# Patient Record
Sex: Female | Born: 1960 | Race: Black or African American | Hispanic: No | Marital: Married | Smoking: Never smoker
Health system: Southern US, Community
[De-identification: ages and names within clinical notes are randomized; demographics above are authoritative.]

## PROBLEM LIST (undated history)

## (undated) HISTORY — PX: APPENDECTOMY: SHX54

## (undated) HISTORY — PX: TUBAL LIGATION: SHX77

## (undated) HISTORY — PX: ABDOMINAL HYSTERECTOMY: SHX81

---

## 2002-08-08 ENCOUNTER — Emergency Department (HOSPITAL_COMMUNITY): Admission: EM | Admit: 2002-08-08 | Discharge: 2002-08-08 | Payer: Self-pay | Admitting: *Deleted

## 2002-08-08 ENCOUNTER — Encounter: Payer: Self-pay | Admitting: *Deleted

## 2003-03-01 ENCOUNTER — Inpatient Hospital Stay (HOSPITAL_COMMUNITY): Admission: AD | Admit: 2003-03-01 | Discharge: 2003-03-05 | Payer: Self-pay | Admitting: Internal Medicine

## 2010-05-16 ENCOUNTER — Emergency Department (HOSPITAL_COMMUNITY)
Admission: EM | Admit: 2010-05-16 | Discharge: 2010-05-16 | Payer: Self-pay | Source: Home / Self Care | Admitting: Emergency Medicine

## 2010-08-21 LAB — CBC
HCT: 39.9 % (ref 36.0–46.0)
Hemoglobin: 13.7 g/dL (ref 12.0–15.0)
MCH: 31.9 pg (ref 26.0–34.0)
MCHC: 34.3 g/dL (ref 30.0–36.0)
MCV: 92.8 fL (ref 78.0–100.0)
Platelets: 291 10*3/uL (ref 150–400)
RBC: 4.3 MIL/uL (ref 3.87–5.11)
RDW: 13.5 % (ref 11.5–15.5)
WBC: 6.4 10*3/uL (ref 4.0–10.5)

## 2010-08-21 LAB — COMPREHENSIVE METABOLIC PANEL
ALT: 19 U/L (ref 0–35)
AST: 21 U/L (ref 0–37)
Albumin: 4.2 g/dL (ref 3.5–5.2)
Alkaline Phosphatase: 63 U/L (ref 39–117)
BUN: 13 mg/dL (ref 6–23)
CO2: 25 mEq/L (ref 19–32)
Calcium: 9.5 mg/dL (ref 8.4–10.5)
Chloride: 103 mEq/L (ref 96–112)
Creatinine, Ser: 0.95 mg/dL (ref 0.4–1.2)
GFR calc Af Amer: >60 mL/min (ref >60)
GFR calc non Af Amer: >60 mL/min (ref >60)
Glucose, Bld: 114 mg/dL — ABNORMAL HIGH (ref 70–99)
Potassium: 3.9 mEq/L (ref 3.5–5.1)
Sodium: 139 mEq/L (ref 135–145)
Total Bilirubin: 0.8 mg/dL (ref 0.3–1.2)
Total Protein: 7.9 g/dL (ref 6.0–8.3)

## 2010-08-21 LAB — URINALYSIS, ROUTINE W REFLEX MICROSCOPIC
Bilirubin Urine: NEGATIVE
Glucose, UA: NEGATIVE mg/dL
Leukocytes, UA: NEGATIVE
Nitrite: NEGATIVE
Protein, ur: NEGATIVE mg/dL
Specific Gravity, Urine: >1.03 — ABNORMAL HIGH (ref 1.005–1.030)
Urobilinogen, UA: 0.2 mg/dL (ref 0.0–1.0)
pH: 5 (ref 5.0–8.0)

## 2010-08-21 LAB — URINE MICROSCOPIC-ADD ON

## 2010-08-21 LAB — POCT CARDIAC MARKERS: Myoglobin, poc: 99.4 ng/mL (ref 12–200)

## 2010-10-27 NOTE — H&P (Signed)
NAME:  Beth Gray, Beth Gray                         ACCOUNT NO.:  [id number]   MEDICAL RECORD NO.:  [id number]                   PATIENT TYPE:  INP   LOCATION:  A341                                 FACILITY:  [facility]   PHYSICIAN:  Tesfaye D. Felecia Shelling, M.D.              DATE OF BIRTH:  [birth year]   DATE OF ADMISSION:  03/01/2003  DATE OF DISCHARGE:                                HISTORY & PHYSICAL   CHIEF COMPLAINT:  Swelling and pain of the left forearm.   HISTORY OF PRESENT ILLNESS:  This is a 50 year old black female with a  history of hypertension who was admitted with above complaints.  The patient  noticed an area of induration and small laceration about 1 week back on her  left forearm.  She had a small discharge and continued to have redness.  On  the third day of the onset she came to the office when she noticed another  area of similar induration.  She was given oral antibiotics and the patient  was sent home.  Two days later when she came for followup there was another  new area of similar lesion with a diffuse swelling and redness of the  forearm.  She was advised to continue the antibiotics and she was given a  prescription of pain medicine.  She came back today for followup and she had  her whole forearm swollen, red and very tender.  There is extension towards  her elbow joint.  There was a pussy discharge.  I have tried to drain as  much pus that could come from her lesion.  The patient was then advised for  admission for IV antibiotics.   REVIEW OF SYSTEMS:  The patient has intermittent low-grade fever.  No  chills, headache, cough, chest pain or shortness of breath.  No nausea,  abdominal pain, or dysuria or urgency.  No leg edema.   PAST MEDICAL HISTORY:  Hypertension.   CURRENT MEDICATION:  Altace 5 mg p.o. once daily.   PERSONAL AND SOCIAL HISTORY:  The patient works at [employer].  She has no  history of alcohol, tobacco, or substance abuse.   PHYSICAL  EXAMINATION:  GENERAL:  The patient is alert/awake and acutely sick  looking.  VITAL SIGNS:  Blood pressure 160/100, pulse 88, respiratory rate 16,  temperature 98 degrees Fahrenheit.  HEENT:  Pupils are equal/reactive.  NECK:  Supple.  CHEST:  Clear lung field.  Good air entry.  CARDIOVASCULAR SYSTEM:  Normal first and second heart sounds.  No murmur.  No gallop.  ABDOMEN:  Soft, lax, bowel sounds is positive.  No mass.  No organomegaly.  EXTREMITIES:  There is a diffuse swelling and redness of the left upper  forearm.  There are three areas where pussy discharge is draining.  Her  elbow joint movement is not affected.  There is no swelling of the joints.   ASSESSMENT:  Cellulitis of the left forearm.    PLAN:  1. We will start the patient on IV Cipro 400 mg IV piggyback b.i.d.  2. We will also start the patient on clindamycin 600 mg IV piggyback q.8h.  3. We will continue the patient on pain medicine and her hypertension     medication.     ___________________________________________                                         Eustaquio Maize Felecia Shelling, M.D.   TDF/MEDQ  D:  03/01/2003  T:  03/01/2003  Job:  [id number]

## 2010-10-27 NOTE — Discharge Summary (Signed)
   NAME:  Beth Gray, Beth Gray                         ACCOUNT NO.:  [id number]   MEDICAL RECORD NO.:  [id number]                   PATIENT TYPE:  INP   LOCATION:  A341                                 FACILITY:  [facility]   PHYSICIAN:  Tesfaye D. Felecia Shelling, M.D.              DATE OF BIRTH:  11-May-1961   DATE OF ADMISSION:  03/01/2003  DATE OF DISCHARGE:  03/05/2003                                 DISCHARGE SUMMARY   DISCHARGE DIAGNOSES:  1. Cellulitis of the left forearm.  2. Hypertension.   DISCHARGE MEDICATIONS:  1. Clindamycin 300 mg p.o. t.i.d. for five days.  2. Altace 5 mg p.o. q.d.   DISPOSITION:  The patient was discharged home in stable condition.   HOSPITAL COURSE:  This is a 50 year old black female with history of  hypertension who was admitted due to swelling and ulceration of the left  upper extremity. The patient thinks she was bitten by a spider. She was  being treated as outpatient. She received wound care and oral antibiotics;  however, her forearm started swelling, and the lesion started spreading. She  was found to have obvious cellulitis. She was admitted and was started on IV  antibiotics.     ___________________________________________                                         Eustaquio Maize Felecia Shelling, M.D.   TDF/MEDQ  D:  04/12/2003  T:  04/12/2003  Job:  [id number]

## 2011-06-06 ENCOUNTER — Emergency Department (HOSPITAL_COMMUNITY)
Admission: EM | Admit: 2011-06-06 | Discharge: 2011-06-06 | Disposition: A | Discharge status: Home / Self Care | Payer: 59 | Attending: Emergency Medicine | Admitting: Emergency Medicine

## 2011-06-06 ENCOUNTER — Other Ambulatory Visit: Payer: Self-pay

## 2011-06-06 ENCOUNTER — Encounter: Payer: Self-pay | Admitting: Emergency Medicine

## 2011-06-06 DIAGNOSIS — R112 Nausea with vomiting, unspecified: Secondary | ICD-10-CM | POA: Insufficient documentation

## 2011-06-06 DIAGNOSIS — R42 Dizziness and giddiness: Secondary | ICD-10-CM | POA: Insufficient documentation

## 2011-06-06 LAB — URINALYSIS, ROUTINE W REFLEX MICROSCOPIC
Glucose, UA: NEGATIVE mg/dL
Hgb urine dipstick: NEGATIVE
Leukocytes, UA: NEGATIVE
pH: 7 (ref 5.0–8.0)

## 2011-06-06 MED ORDER — MECLIZINE HCL 25 MG PO TABS: 25.0000 mg | ORAL_TABLET | Freq: Three times a day (TID) | ORAL | Status: AC | PRN

## 2011-06-06 MED ORDER — LORAZEPAM 1 MG PO TABS
2.0000 mg | ORAL_TABLET | Freq: Once | ORAL | Status: AC
  Administered 2011-06-06: 2 mg via ORAL
  Filled 2011-06-06: qty 2

## 2011-06-06 MED ORDER — ONDANSETRON 8 MG PO TBDP
8.0000 mg | ORAL_TABLET | Freq: Once | ORAL | Status: AC
  Administered 2011-06-06: 8 mg via ORAL
  Filled 2011-06-06: qty 1

## 2011-06-06 MED ORDER — MECLIZINE HCL 12.5 MG PO TABS
25.0000 mg | ORAL_TABLET | Freq: Once | ORAL | Status: AC
  Administered 2011-06-06: 25 mg via ORAL
  Filled 2011-06-06: qty 2

## 2011-06-06 NOTE — ED Notes (Addendum)
Patient c/o dizziness, nausea, and vomiting that started this morning. Denies any fevers. Patient does report headache and mid abd pain.

## 2011-06-06 NOTE — ED Provider Notes (Signed)
History     CSN: [id number]  Arrival date & time 06/06/11  1147   First MD Initiated Contact with Patient 06/06/11 1345      Chief Complaint  Patient presents with  . Dizziness  . Nausea  . Emesis     HPI  Dizziness/vertigo -  ONSET - THIS MORNING COURSE - WORSENING SEVERITY - MODERATE IT IS INTERMITTENT WORSENED BY - SITTING UP IMPROVED WITH - LYING SUPINE ASSOCIATED SYMPTOMS - NAUSEA/VOMITING AND MILD HEADACHE   PT REPORTS FEELING THAT "SURROUNDINGS SPINNING" SINCE THIS MORNING NO CP/SOB REPORTED NO FOCAL WEAKNESS NO FALLS SHE REPORTS MILD HEADACHE NO VISUAL CHANGES, NO HEARING CHANGES NO NECK/BACK PAIN SHE REPORTS MILD ABD PAIN No diarrhea  History reviewed. No pertinent past medical history.  Past Surgical History  Procedure Date  . Abdominal hysterectomy   . Appendectomy   . Tubal ligation     Family History  Problem Relation Age of Onset  . Cancer Other   . Diabetes Other   . Stroke Other   . Hypertension Other     History  Substance Use Topics  . Smoking status: Never Smoker   . Smokeless tobacco: Never Used  . Alcohol Use: No    OB History    Grav Para Term Preterm Abortions TAB SAB Ect Mult Living   4 2 2  2  2   2       Review of Systems  All other systems reviewed and are negative.    Allergies  Penicillins  Home Medications  No current outpatient prescriptions on file.  BP 154/98  Pulse 72  Temp(Src) 98 F (36.7 C) (Oral)  Resp 15  Ht 5\' 4"  (1.626 m)  Wt 203 lb (92.08 kg)  BMI 34.84 kg/m2  SpO2 100%  Physical Exam  CONSTITUTIONAL: Well developed/well nourished HEAD AND FACE: Normocephalic/atraumatic EYES: EOMI/PERRL, no nystagmus ENMT: Mucous membranes moist, left TM/right TM normal NECK: supple no meningeal signs SPINE:entire spine nontender CV: S1/S2 noted, no murmurs/rubs/gallops noted LUNGS: Lungs are clear to auscultation bilaterally, no apparent distress ABDOMEN: soft, nontender, no rebound or  guarding GU:no cva tenderness NEURO: Awake/alert, facies symmetric, no arm or leg drift is noted Cranial nerves 3/4/5/6/12/17/08/11/12 tested and intact Gait normal No pastpointing EXTREMITIES: pulses normal, full ROM SKIN: warm, color normal PSYCH: no abnormalities of mood noted    ED Course  Procedures  Labs Reviewed  URINALYSIS, ROUTINE W REFLEX MICROSCOPIC - Abnormal; Notable for the following:    Color, Urine STRAW (*)    All other components within normal limits   2:59 PM Pt well appearing Doubt acute neurologic event at this time  4:55 PM Pt improved Stable for d/c Discussed strict return precautions    MDM  Nursing notes reviewed and considered in documentation All labs/vitals reviewed and considered Previous records reviewed and considered        Date: 06/06/2011  Rate: 61  Rhythm: normal sinus rhythm  QRS Axis: normal  Intervals: normal  ST/T Wave abnormalities: nonspecific ST changes  Conduction Disutrbances:none  Narrative Interpretation:   Old EKG Reviewed: unchanged    Joya Gaskins, MD 06/06/11 1657

## 2011-06-06 NOTE — ED Notes (Signed)
Patient with no complaints at this time. Respirations even and unlabored. Skin warm/dry. Discharge instructions reviewed with patient at this time. Patient given opportunity to voice concerns/ask questions. Patient discharged at this time and left Emergency Department with steady gait.   

## 2012-11-29 ENCOUNTER — Emergency Department (HOSPITAL_COMMUNITY)
Admission: EM | Admit: 2012-11-29 | Discharge: 2012-11-29 | Disposition: A | Discharge status: Home / Self Care | Payer: 59 | Attending: Emergency Medicine | Admitting: Emergency Medicine

## 2012-11-29 ENCOUNTER — Encounter (HOSPITAL_COMMUNITY): Payer: Self-pay | Admitting: *Deleted

## 2012-11-29 ENCOUNTER — Other Ambulatory Visit: Payer: Self-pay

## 2012-11-29 ENCOUNTER — Emergency Department (HOSPITAL_COMMUNITY): Payer: 59

## 2012-11-29 DIAGNOSIS — Z880 Allergy status to penicillin: Secondary | ICD-10-CM | POA: Insufficient documentation

## 2012-11-29 DIAGNOSIS — R0789 Other chest pain: Secondary | ICD-10-CM

## 2012-11-29 DIAGNOSIS — R071 Chest pain on breathing: Secondary | ICD-10-CM | POA: Insufficient documentation

## 2012-11-29 DIAGNOSIS — R05 Cough: Secondary | ICD-10-CM | POA: Insufficient documentation

## 2012-11-29 DIAGNOSIS — R059 Cough, unspecified: Secondary | ICD-10-CM | POA: Insufficient documentation

## 2012-11-29 LAB — BASIC METABOLIC PANEL
BUN: 13 mg/dL (ref 6–23)
CO2: 29 mEq/L (ref 19–32)
Chloride: 102 mEq/L (ref 96–112)
Creatinine, Ser: 0.83 mg/dL (ref 0.50–1.10)
GFR calc Af Amer: >90 mL/min (ref >90)
Glucose, Bld: 115 mg/dL — ABNORMAL HIGH (ref 70–99)
Potassium: 3.5 mEq/L (ref 3.5–5.1)

## 2012-11-29 LAB — D-DIMER, QUANTITATIVE: D-Dimer, Quant: 0.38 ug/mL-FEU (ref 0.00–0.48)

## 2012-11-29 LAB — CBC WITH DIFFERENTIAL/PLATELET
Basophils Relative: 0 % (ref 0–1)
HCT: 34.6 % — ABNORMAL LOW (ref 36.0–46.0)
Hemoglobin: 11.5 g/dL — ABNORMAL LOW (ref 12.0–15.0)
Lymphocytes Relative: 35 % (ref 12–46)
Lymphs Abs: 1.6 10*3/uL (ref 0.7–4.0)
Monocytes Absolute: 0.6 10*3/uL (ref 0.1–1.0)
Monocytes Relative: 14 % — ABNORMAL HIGH (ref 3–12)
Neutro Abs: 1.9 10*3/uL (ref 1.7–7.7)
Neutrophils Relative %: 43 % (ref 43–77)
RBC: 3.62 MIL/uL — ABNORMAL LOW (ref 3.87–5.11)
WBC: 4.5 10*3/uL (ref 4.0–10.5)

## 2012-11-29 NOTE — ED Provider Notes (Signed)
History     CSN: [id number]  Arrival date & time 11/29/12  0224   First MD Initiated Contact with Patient 11/29/12 0413      Chief Complaint  Patient presents with  . Chest Pain    rt side, worse with inspiration  . Back Pain    rt side    (Consider location/radiation/quality/duration/timing/severity/associated sxs/prior treatment) HPI Comments: Beth Gray is a 52 y.o. female who presents complaining of right chest pain. That started yesterday. The pain is persistent. It is dull. She denies trauma. She has had a mild, nonproductive cough. She denies shortness of breath, nausea, vomiting, fever, chills, weakness, or dizziness. She has never had this previously. She has not tried anything for the problem. There are no known modifying factors.  The history is provided by the patient.    History reviewed. No pertinent past medical history.  Past Surgical History  Procedure Laterality Date  . Abdominal hysterectomy    . Appendectomy    . Tubal ligation      Family History  Problem Relation Age of Onset  . Cancer Other   . Diabetes Other   . Stroke Other   . Hypertension Other     History  Substance Use Topics  . Smoking status: Never Smoker   . Smokeless tobacco: Never Used  . Alcohol Use: No    OB History   Grav Para Term Preterm Abortions TAB SAB Ect Mult Living   4 2 2  2  2   2       Review of Systems  All other systems reviewed and are negative.    Allergies  Penicillins  Home Medications  No current outpatient prescriptions on file.  BP 153/94  Pulse 79  Temp(Src) 98.2 F (36.8 C) (Oral)  SpO2 95%  Physical Exam  Nursing note and vitals reviewed. Constitutional: She is oriented to person, place, and time. She appears well-developed and well-nourished.  HENT:  Head: Normocephalic and atraumatic.  Eyes: Conjunctivae and EOM are normal. Pupils are equal, round, and reactive to light.  Neck: Normal range of motion and phonation normal. Neck  supple.  Cardiovascular: Normal rate, regular rhythm and intact distal pulses.   Pulmonary/Chest: Effort normal and breath sounds normal. She exhibits tenderness (Mild right anterior and posterior chest wall tenderness to palpation).  Abdominal: Soft. She exhibits no distension. There is no tenderness. There is no guarding.  Musculoskeletal: Normal range of motion.  Neurological: She is alert and oriented to person, place, and time. She has normal strength. She exhibits normal muscle tone.  Skin: Skin is warm and dry.  Psychiatric: She has a normal mood and affect. Her behavior is normal. Judgment and thought content normal.    ED Course  Procedures (including critical care time)     Date: 11/29/12  Rate: 79  Rhythm: normal sinus rhythm  QRS Axis: normal  PR and QT Intervals: normal  ST/T Wave abnormalities: normal  PR and QRS Conduction Disutrbances:none  Narrative Interpretation:   Old EKG Reviewed: changes noted-nonspecific ST and T wave abnormality is resolved since 06/06/11    Labs Reviewed  CBC WITH DIFFERENTIAL - Abnormal; Notable for the following:    RBC 3.62 (*)    Hemoglobin 11.5 (*)    HCT 34.6 (*)    Monocytes Relative 14 (*)    Eosinophils Relative 8 (*)    All other components within normal limits  BASIC METABOLIC PANEL - Abnormal; Notable for the following:  Glucose, Bld 115 (*)    GFR calc non Af Amer 80 (*)    All other components within normal limits  D-DIMER, QUANTITATIVE   Dg Chest Portable 1 View  11/29/2012   *RADIOLOGY REPORT*  Clinical Data: Right side chest and back pain worse with a deep breath  PORTABLE CHEST - 1 VIEW  Comparison: Portable exam 0311 hours compared to 05/16/2010  Findings: Normal heart size, mediastinal contours and pulmonary vascularity for technique. Lungs clear. No pleural effusion or pneumothorax. Bones unremarkable.  IMPRESSION: No acute abnormalities.   Original Report Authenticated By: Ulyses Southward, M.D.     1. Chest wall  pain       MDM  Nonspecific chest pain, chest wall tenderness. Doubt ACS, PE, or pneumonia. Doubt metabolic instability, serious bacterial infection or impending vascular collapse; the patient is stable for discharge.    Nursing Notes Reviewed/ Care Coordinated, and agree without changes. Applicable Imaging Reviewed.  Interpretation of Laboratory Data incorporated into ED treatment    Plan: Home Medications-  symptomatic treatment with Tylenol or ibuprofen ; Home Treatments and Observation- heat to sore area; return here if the recommended treatment, does not improve the symptoms; Recommended follow up- PCP, for checkup in one week     Flint Melter, MD 11/29/12 [phone]

## 2012-11-29 NOTE — ED Notes (Signed)
Discharge instructions reviewed with pt, questions answered. Pt verbalized understanding.  

## 2012-11-29 NOTE — ED Notes (Signed)
Pt states rt side of chest hurts and radiates to rt side of back, worse when taking a deep breath.

## 2014-04-12 ENCOUNTER — Encounter (HOSPITAL_COMMUNITY): Payer: Self-pay | Admitting: *Deleted

## 2014-11-27 IMAGING — DX DG CHEST 1V PORT
1 series · 1 of 1 positions shown · non-contrast
Comparison: Portable exam 1444 hours compared to 05/16/2010

CLINICAL DATA: Right side chest and back pain worse with a deep
breath

PORTABLE CHEST - 1 VIEW

[portable]
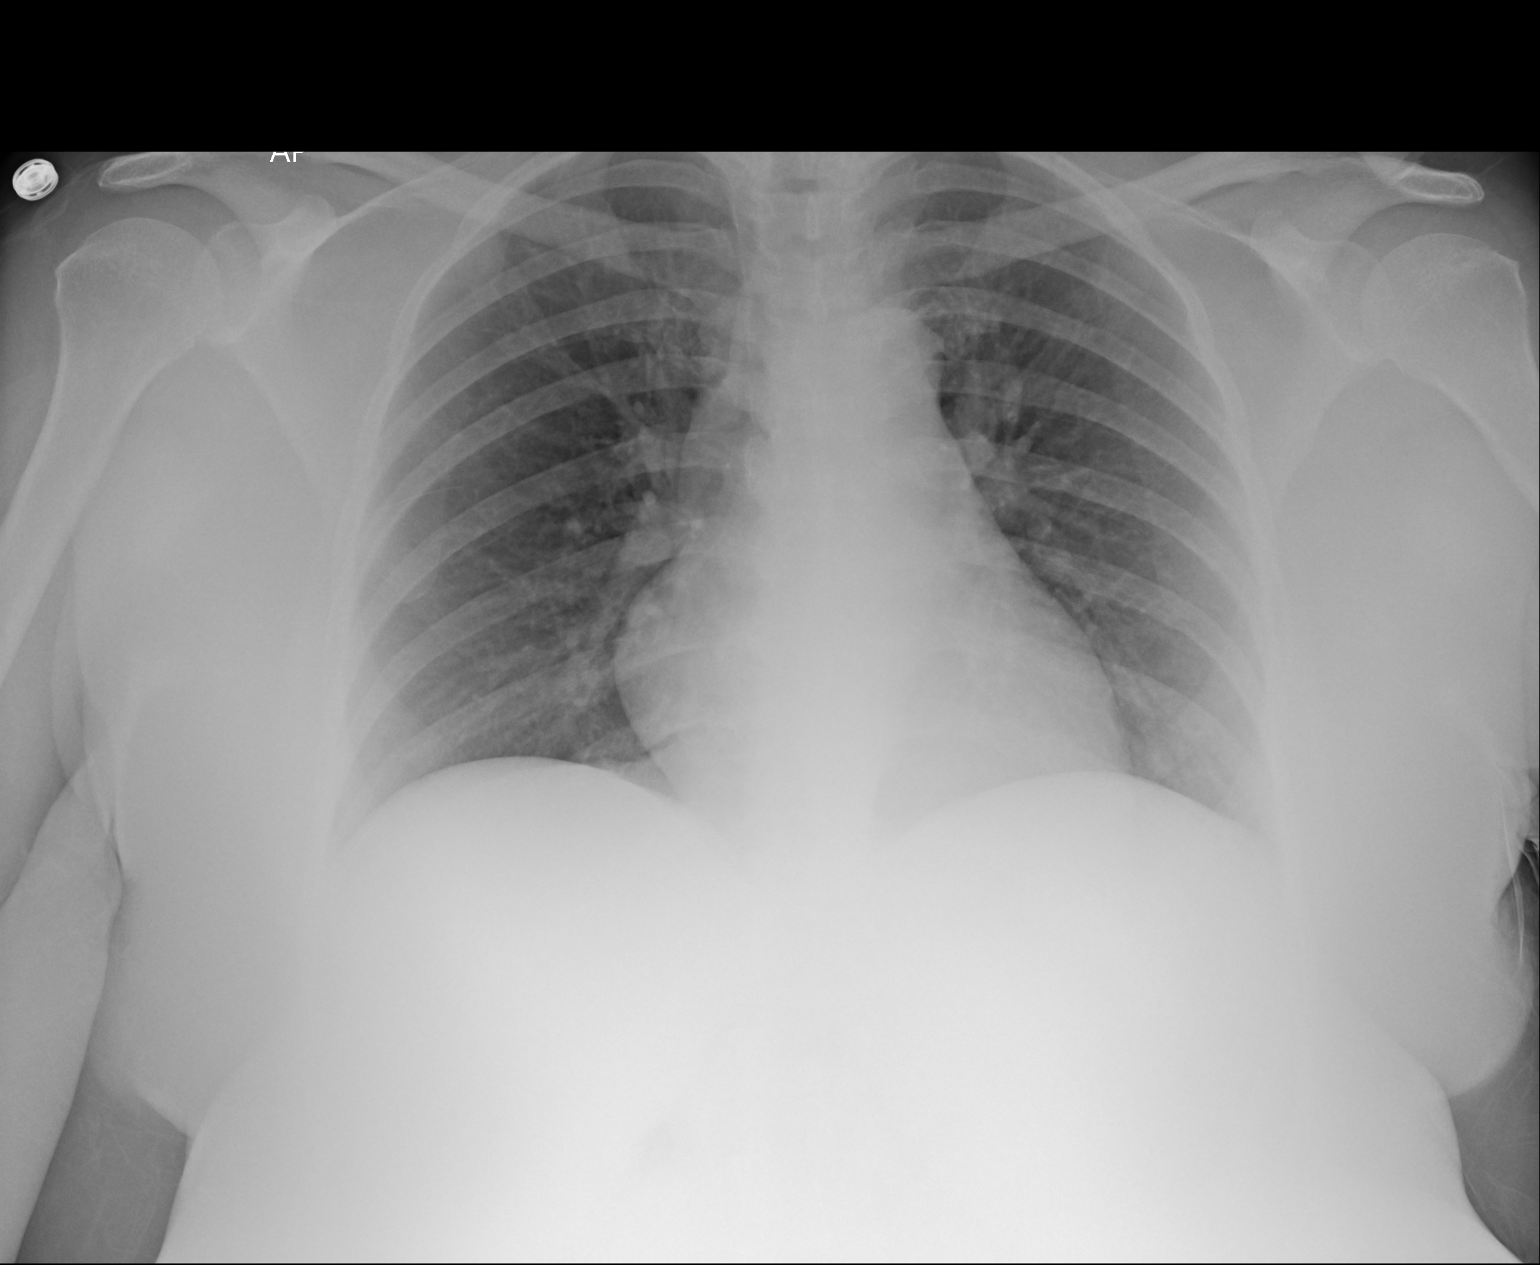

[1 of 1 positions shown; findings below may reference images not displayed]

FINDINGS: Normal heart size, mediastinal contours and pulmonary vascularity
for technique.
Lungs clear.
No pleural effusion or pneumothorax.
Bones unremarkable.
IMPRESSION: No acute abnormalities.

## 2016-07-11 ENCOUNTER — Emergency Department (HOSPITAL_COMMUNITY)
Admission: EM | Admit: 2016-07-11 | Discharge: 2016-07-11 | Disposition: A | Discharge status: Home / Self Care | Payer: BLUE CROSS/BLUE SHIELD | Attending: Emergency Medicine | Admitting: Emergency Medicine

## 2016-07-11 ENCOUNTER — Emergency Department (HOSPITAL_COMMUNITY): Admission: EM | Admit: 2016-07-11 | Discharge: 2016-07-11 | Discharge status: Absent without leave | Payer: Self-pay

## 2016-07-11 ENCOUNTER — Encounter (HOSPITAL_COMMUNITY): Payer: Self-pay | Admitting: *Deleted

## 2016-07-11 DIAGNOSIS — S61411A Laceration without foreign body of right hand, initial encounter: Secondary | ICD-10-CM | POA: Insufficient documentation

## 2016-07-11 DIAGNOSIS — Y999 Unspecified external cause status: Secondary | ICD-10-CM | POA: Insufficient documentation

## 2016-07-11 DIAGNOSIS — W268XXA Contact with other sharp object(s), not elsewhere classified, initial encounter: Secondary | ICD-10-CM | POA: Insufficient documentation

## 2016-07-11 DIAGNOSIS — Y9389 Activity, other specified: Secondary | ICD-10-CM | POA: Diagnosis not present

## 2016-07-11 DIAGNOSIS — Y929 Unspecified place or not applicable: Secondary | ICD-10-CM | POA: Insufficient documentation

## 2016-07-11 MED ORDER — BUPIVACAINE HCL (PF) 0.25 % IJ SOLN
10.0000 mL | Freq: Once | INTRAMUSCULAR | Status: AC
  Administered 2016-07-11: 10 mL
  Filled 2016-07-11: qty 30

## 2016-07-11 NOTE — ED Provider Notes (Signed)
[facility] Provider Note   CSN: [id number] Arrival date & time: 07/11/16  0102  Time seen 02:22 AM   History   Chief Complaint Chief Complaint  Patient presents with  . Laceration    HPI Beth Gray is a 56 y.o. female.  HPI  patient states around midnight she opened up a can and she accidentally cut the dorsum of her right hand on the lid. She states it continued to bleed so she came to the ED. Patient is right-handed. She denies any numbness or motor weakness in her hand. She states her last tetanus was 2 years ago.  PCP   Dr Felecia ShellingFanta  History reviewed. No pertinent past medical history.  There are no active problems to display for this patient.   Past Surgical History:  Procedure Laterality Date  . ABDOMINAL HYSTERECTOMY    . APPENDECTOMY    . TUBAL LIGATION      OB History    Gravida Para Term Preterm AB Living   4 2 2   2 2    SAB TAB Ectopic Multiple Live Births   2               Home Medications    Prior to Admission medications   Not on File    Family History Family History  Problem Relation Age of Onset  . Cancer Other   . Diabetes Other   . Stroke Other   . Hypertension Other     Social History Social History  Substance Use Topics  . Smoking status: Never Smoker  . Smokeless tobacco: Never Used  . Alcohol use No  employed in the OR at [employer]   Allergies   Penicillins   Review of Systems Review of Systems  All other systems reviewed and are negative.    Physical Exam Updated Vital Signs BP (!) 168/101   Pulse 84   Temp 97.8 F (36.6 C) (Oral)   Resp 20   Ht 5\' 4"  (1.626 m)   Wt 200 lb (90.7 kg)   SpO2 99%   BMI 34.33 kg/m   Vital signs normal except for hypertension   Physical Exam  Constitutional: She appears well-developed and well-nourished.  HENT:  Head: Normocephalic and atraumatic.  Right Ear: External ear normal.  Left Ear: External ear normal.  Eyes: Conjunctivae and EOM are normal.   Neck: Normal range of motion.  Cardiovascular: Normal rate.   Pulmonary/Chest: Effort normal. No respiratory distress.  Musculoskeletal: Normal range of motion. She exhibits no edema or deformity.  Skin: Skin is warm and dry.  Pt has a 2.5 cm laceration, linear, on the dorsum of her right hand, it goes just through the dermis.        ED Treatments / Results   Procedures Procedures (including critical care time)  LACERATION REPAIR Performed by: Ward GivensIva L Nakaila Freeze Authorized by: Ward GivensIva L Shela Esses Consent: Verbal consent obtained. Risks and benefits: risks, benefits and alternatives were discussed Consent given by: patient Patient identity confirmed: provided demographic data Prepped and Draped in normal sterile fashion Wound explored  Laceration Location: dorsum of right hand  Laceration Length: 2.5 cm  No Foreign Bodies seen or palpated  Anesthesia: local infiltration  Local anesthetic: 0.25 % marcaine  Anesthetic total: 6 ml   Amount of cleaning: standard betadyne/saline mix  Skin closure: 4-0 nylon   Number of sutures: 5  Technique: simple interrupted  Patient tolerance: Patient tolerated the procedure well with no immediate complications.  Medications Ordered in ED Medications  bupivacaine (PF) (MARCAINE) 0.25 % injection 10 mL (not administered)     Initial Impression / Assessment and Plan / ED Course  I have reviewed the triage vital signs and the nursing notes.  Pertinent labs & imaging results that were available during my care of the patient were reviewed by me and considered in my medical decision making (see chart for details).       Final Clinical Impressions(s) / ED Diagnoses   Final diagnoses:  Laceration of right hand without foreign body, initial encounter    Plan discharge  Devoria Albe, MD, Concha Pyo, MD 07/11/16 [phone]

## 2016-07-11 NOTE — Discharge Instructions (Signed)
Keep the wound clean and dry. DO use triple antibiotic ointment on the wound. You can take acetaminophen if needed for pain. The sutures need to be removed in 10-12 days. Recheck sooner for any signs of infection (increased redness, pain, swelling, red streaks, pus draining from the wound).

## 2016-07-11 NOTE — ED Triage Notes (Signed)
Pt c/o l laceration to top of right hand that happened while opening a can

## 2019-06-18 ENCOUNTER — Other Ambulatory Visit (HOSPITAL_COMMUNITY): Payer: Self-pay | Admitting: Internal Medicine

## 2019-06-18 DIAGNOSIS — Z6834 Body mass index (BMI) 34.0-34.9, adult: Secondary | ICD-10-CM | POA: Diagnosis not present

## 2019-06-18 DIAGNOSIS — N390 Urinary tract infection, site not specified: Secondary | ICD-10-CM | POA: Diagnosis not present

## 2019-06-18 DIAGNOSIS — R319 Hematuria, unspecified: Secondary | ICD-10-CM | POA: Diagnosis not present

## 2019-06-18 DIAGNOSIS — R739 Hyperglycemia, unspecified: Secondary | ICD-10-CM | POA: Diagnosis not present

## 2019-06-18 DIAGNOSIS — Z1231 Encounter for screening mammogram for malignant neoplasm of breast: Secondary | ICD-10-CM

## 2019-06-18 DIAGNOSIS — Z0001 Encounter for general adult medical examination with abnormal findings: Secondary | ICD-10-CM | POA: Diagnosis not present

## 2019-06-18 DIAGNOSIS — E785 Hyperlipidemia, unspecified: Secondary | ICD-10-CM | POA: Diagnosis not present

## 2019-06-19 ENCOUNTER — Encounter: Payer: Self-pay | Admitting: Gastroenterology

## 2019-06-24 DIAGNOSIS — E785 Hyperlipidemia, unspecified: Secondary | ICD-10-CM | POA: Diagnosis not present

## 2019-06-24 DIAGNOSIS — Z6835 Body mass index (BMI) 35.0-35.9, adult: Secondary | ICD-10-CM | POA: Diagnosis not present

## 2019-06-24 DIAGNOSIS — I10 Essential (primary) hypertension: Secondary | ICD-10-CM | POA: Diagnosis not present

## 2019-06-24 DIAGNOSIS — E1165 Type 2 diabetes mellitus with hyperglycemia: Secondary | ICD-10-CM | POA: Diagnosis not present

## 2019-08-03 ENCOUNTER — Other Ambulatory Visit: Payer: Self-pay

## 2019-08-03 ENCOUNTER — Ambulatory Visit: Payer: Self-pay

## 2019-08-20 ENCOUNTER — Ambulatory Visit: Payer: Self-pay | Attending: Internal Medicine

## 2019-08-20 DIAGNOSIS — Z23 Encounter for immunization: Secondary | ICD-10-CM

## 2019-08-20 NOTE — Progress Notes (Signed)
   Covid-19 Vaccination Clinic  Name:  Beth Gray    MRN: [id number] DOB: [birth year]  08/20/2019  [name] was observed post Covid-19 immunization for 15 minutes without incident. She was provided with Vaccine Information Sheet and instruction to access the V-Safe system.   [name] was instructed to call 911 with any severe reactions post vaccine: Marland Kitchen Difficulty breathing  . Swelling of face and throat  . A fast heartbeat  . A bad rash all over body  . Dizziness and weakness   Immunizations Administered    Name Date Dose VIS Date Route   Moderna COVID-19 Vaccine 08/20/2019  8:42 AM 0.5 mL 05/12/2019 Intramuscular   Manufacturer: Moderna   Lot: [id number]   NDC: 27741-287-86

## 2019-09-21 ENCOUNTER — Ambulatory Visit (HOSPITAL_COMMUNITY): Payer: Self-pay

## 2019-09-22 ENCOUNTER — Ambulatory Visit: Payer: Self-pay | Attending: Internal Medicine

## 2019-09-22 DIAGNOSIS — Z23 Encounter for immunization: Secondary | ICD-10-CM

## 2019-09-22 NOTE — Progress Notes (Signed)
   Covid-19 Vaccination Clinic  Name:  Beth Gray    MRN: [id number] DOB: 1961-01-09  09/22/2019  [name] was observed post Covid-19 immunization for 15 minutes without incident. She was provided with Vaccine Information Sheet and instruction to access the V-Safe system.   [name] was instructed to call 911 with any severe reactions post vaccine: Marland Kitchen Difficulty breathing  . Swelling of face and throat  . A fast heartbeat  . A bad rash all over body  . Dizziness and weakness   Immunizations Administered    Name Date Dose VIS Date Route   Moderna COVID-19 Vaccine 09/22/2019  8:08 AM 0.5 mL 05/12/2019 Intramuscular   Manufacturer: Moderna   Lot: [id number]   NDC: 03559-741-63

## 2019-10-05 ENCOUNTER — Ambulatory Visit: Payer: Self-pay

## 2019-10-05 ENCOUNTER — Other Ambulatory Visit: Payer: Self-pay

## 2019-12-18 DIAGNOSIS — Z79899 Other long term (current) drug therapy: Secondary | ICD-10-CM | POA: Diagnosis not present

## 2019-12-18 DIAGNOSIS — Z0001 Encounter for general adult medical examination with abnormal findings: Secondary | ICD-10-CM | POA: Diagnosis not present

## 2019-12-18 DIAGNOSIS — E785 Hyperlipidemia, unspecified: Secondary | ICD-10-CM | POA: Diagnosis not present

## 2019-12-18 DIAGNOSIS — I10 Essential (primary) hypertension: Secondary | ICD-10-CM | POA: Diagnosis not present

## 2019-12-18 DIAGNOSIS — E1165 Type 2 diabetes mellitus with hyperglycemia: Secondary | ICD-10-CM | POA: Diagnosis not present

## 2019-12-23 DIAGNOSIS — Z1212 Encounter for screening for malignant neoplasm of rectum: Secondary | ICD-10-CM | POA: Diagnosis not present

## 2019-12-23 DIAGNOSIS — Z1211 Encounter for screening for malignant neoplasm of colon: Secondary | ICD-10-CM | POA: Diagnosis not present

## 2020-01-12 DIAGNOSIS — E119 Type 2 diabetes mellitus without complications: Secondary | ICD-10-CM | POA: Diagnosis not present

## 2020-04-22 DIAGNOSIS — E1165 Type 2 diabetes mellitus with hyperglycemia: Secondary | ICD-10-CM | POA: Diagnosis not present

## 2020-04-22 DIAGNOSIS — Z6835 Body mass index (BMI) 35.0-35.9, adult: Secondary | ICD-10-CM | POA: Diagnosis not present

## 2020-04-22 DIAGNOSIS — I10 Essential (primary) hypertension: Secondary | ICD-10-CM | POA: Diagnosis not present

## 2020-04-22 DIAGNOSIS — E785 Hyperlipidemia, unspecified: Secondary | ICD-10-CM | POA: Diagnosis not present

## 2020-04-22 DIAGNOSIS — Z23 Encounter for immunization: Secondary | ICD-10-CM | POA: Diagnosis not present

## 2020-07-25 DIAGNOSIS — E1165 Type 2 diabetes mellitus with hyperglycemia: Secondary | ICD-10-CM | POA: Diagnosis not present

## 2020-07-25 DIAGNOSIS — I10 Essential (primary) hypertension: Secondary | ICD-10-CM | POA: Diagnosis not present

## 2020-07-25 DIAGNOSIS — Z6833 Body mass index (BMI) 33.0-33.9, adult: Secondary | ICD-10-CM | POA: Diagnosis not present

## 2020-07-25 DIAGNOSIS — E785 Hyperlipidemia, unspecified: Secondary | ICD-10-CM | POA: Diagnosis not present

## 2020-07-27 DIAGNOSIS — I10 Essential (primary) hypertension: Secondary | ICD-10-CM | POA: Diagnosis not present

## 2020-12-26 DIAGNOSIS — E1165 Type 2 diabetes mellitus with hyperglycemia: Secondary | ICD-10-CM | POA: Diagnosis not present

## 2020-12-26 DIAGNOSIS — I10 Essential (primary) hypertension: Secondary | ICD-10-CM | POA: Diagnosis not present

## 2020-12-26 DIAGNOSIS — J029 Acute pharyngitis, unspecified: Secondary | ICD-10-CM | POA: Diagnosis not present

## 2021-01-02 DIAGNOSIS — Z23 Encounter for immunization: Secondary | ICD-10-CM | POA: Diagnosis not present

## 2021-01-02 DIAGNOSIS — E785 Hyperlipidemia, unspecified: Secondary | ICD-10-CM | POA: Diagnosis not present

## 2021-01-02 DIAGNOSIS — E1165 Type 2 diabetes mellitus with hyperglycemia: Secondary | ICD-10-CM | POA: Diagnosis not present

## 2021-01-02 DIAGNOSIS — I10 Essential (primary) hypertension: Secondary | ICD-10-CM | POA: Diagnosis not present

## 2021-01-02 DIAGNOSIS — Z0001 Encounter for general adult medical examination with abnormal findings: Secondary | ICD-10-CM | POA: Diagnosis not present

## 2021-02-06 DIAGNOSIS — I10 Essential (primary) hypertension: Secondary | ICD-10-CM | POA: Diagnosis not present

## 2021-02-06 DIAGNOSIS — E1165 Type 2 diabetes mellitus with hyperglycemia: Secondary | ICD-10-CM | POA: Diagnosis not present

## 2021-04-26 DIAGNOSIS — Z6833 Body mass index (BMI) 33.0-33.9, adult: Secondary | ICD-10-CM | POA: Diagnosis not present

## 2021-04-26 DIAGNOSIS — I10 Essential (primary) hypertension: Secondary | ICD-10-CM | POA: Diagnosis not present

## 2021-04-26 DIAGNOSIS — E1165 Type 2 diabetes mellitus with hyperglycemia: Secondary | ICD-10-CM | POA: Diagnosis not present

## 2021-04-26 DIAGNOSIS — E785 Hyperlipidemia, unspecified: Secondary | ICD-10-CM | POA: Diagnosis not present

## 2021-11-17 DIAGNOSIS — E1165 Type 2 diabetes mellitus with hyperglycemia: Secondary | ICD-10-CM | POA: Diagnosis not present

## 2021-11-17 DIAGNOSIS — E785 Hyperlipidemia, unspecified: Secondary | ICD-10-CM | POA: Diagnosis not present

## 2021-11-17 DIAGNOSIS — Z0001 Encounter for general adult medical examination with abnormal findings: Secondary | ICD-10-CM | POA: Diagnosis not present

## 2021-11-17 DIAGNOSIS — I10 Essential (primary) hypertension: Secondary | ICD-10-CM | POA: Diagnosis not present

## 2024-04-13 ENCOUNTER — Ambulatory Visit
Admission: EM | Admit: 2024-04-13 | Discharge: 2024-04-13 | Disposition: A | Discharge status: Home / Self Care | Attending: Nurse Practitioner | Admitting: Nurse Practitioner

## 2024-04-13 DIAGNOSIS — K047 Periapical abscess without sinus: Secondary | ICD-10-CM

## 2024-04-13 MED ORDER — CLINDAMYCIN HCL 150 MG PO CAPS: 450.0000 mg | ORAL_CAPSULE | Freq: Three times a day (TID) | ORAL | 0 refills | Status: AC

## 2024-04-13 MED ORDER — KETOROLAC TROMETHAMINE 30 MG/ML IJ SOLN
30.0000 mg | Freq: Once | INTRAMUSCULAR | Status: AC
  Administered 2024-04-13: 30 mg via INTRAMUSCULAR

## 2024-04-13 MED ORDER — ACETAMINOPHEN ER 650 MG PO TBCR: 650.0000 mg | EXTENDED_RELEASE_TABLET | Freq: Three times a day (TID) | ORAL | 0 refills | Status: AC

## 2024-04-13 NOTE — ED Triage Notes (Signed)
 Pt reports left sided facial swelling and pain in the neck x 3 days

## 2024-04-13 NOTE — Discharge Instructions (Addendum)
 Take medication as prescribed.  Recommend taking the medication with a probiotic to prevent GI upset. Continue over-the-counter Tylenol as needed for pain or discomfort. Recommend the use of warm compresses to the affected area to help with pain or discomfort.  You may use cool compresses to help with pain or swelling. Warm salt water gargles 3-4 times daily as needed while symptoms persist. Continue a soft diet to include soup, broth, yogurt, pudding, or Jell-O while symptoms persist. It is recommended that you follow-up with a dentist within the next 7 to 10 days for reevaluation.  I have provided a list of dental resources in the area. Go to the emergency department if you experience worsening pain, facial swelling, or other concerns. Follow-up as needed.

## 2024-04-13 NOTE — ED Provider Notes (Signed)
 [facility]    CSN: [id number] Arrival date & time: 04/13/24  1027      History   Chief Complaint No chief complaint on file.   HPI Beth Gray is a 63 y.o. female.   The history is provided by the patient.   Patient presents for complaints of left-sided facial pain and swelling that started over the past 2 to 3 days.  Patient states she first noticed pain in the left side of her mouth, and the swelling started yesterday.  She also complains of pain that radiates behind the left ear and into the left side of her neck.  She denies fever, chills, injury, trauma, chest pain, abdominal pain, nausea, or vomiting.  Patient states that she does have some teeth that are broken in the same area.  History reviewed. No pertinent past medical history.  There are no active problems to display for this patient.   Past Surgical History:  Procedure Laterality Date   ABDOMINAL HYSTERECTOMY     APPENDECTOMY     TUBAL LIGATION      OB History     Gravida  4   Para  2   Term  2   Preterm      AB  2   Living  2      SAB  2   IAB      Ectopic      Multiple      Live Births               Home Medications    Prior to Admission medications   Medication Sig Start Date End Date Taking? Authorizing Provider  acetaminophen (TYLENOL 8 HOUR) 650 MG CR tablet Take 1 tablet (650 mg total) by mouth every 8 (eight) hours. 04/13/24  Yes Leath-Warren, Etta PARAS, NP  clindamycin (CLEOCIN) 150 MG capsule Take 3 capsules (450 mg total) by mouth 3 (three) times daily for 7 days. 04/13/24 04/20/24 Yes Leath-Warren, Etta PARAS, NP    Family History Family History  Problem Relation Age of Onset   Cancer Other    Diabetes Other    Stroke Other    Hypertension Other     Social History Social History   Tobacco Use   Smoking status: Never   Smokeless tobacco: Never  Substance Use Topics   Alcohol use: No   Drug use: No     Allergies    Penicillins   Review of Systems Review of Systems Per HPI  Physical Exam Triage Vital Signs ED Triage Vitals  Encounter Vitals Group     BP 04/13/24 1042 (!) 141/88     Girls Systolic BP Percentile --      Girls Diastolic BP Percentile --      Boys Systolic BP Percentile --      Boys Diastolic BP Percentile --      Pulse Rate 04/13/24 1042 92     Resp 04/13/24 1042 20     Temp 04/13/24 1042 97.7 F (36.5 C)     Temp Source 04/13/24 1042 Oral     SpO2 04/13/24 1042 95 %     Weight --      Height --      Head Circumference --      Peak Flow --      Pain Score 04/13/24 1044 10     Pain Loc --      Pain Education --      Exclude from [employer]  Chart --    No data found.  Updated Vital Signs BP (!) 141/88 (BP Location: Right Arm)   Pulse 92   Temp 97.7 F (36.5 C) (Oral)   Resp 20   SpO2 95%   Visual Acuity Right Eye Distance:   Left Eye Distance:   Bilateral Distance:    Right Eye Near:   Left Eye Near:    Bilateral Near:     Physical Exam Vitals and nursing note reviewed.  Constitutional:      General: She is not in acute distress.    Appearance: Normal appearance.  HENT:     Head: Normocephalic.     Right Ear: Tympanic membrane, ear canal and external ear normal.     Left Ear: Tympanic membrane, ear canal and external ear normal.     Nose: Nose normal.     Mouth/Throat:     Lips: Pink.     Mouth: Mucous membranes are moist.     Dentition: Abnormal dentition. Dental tenderness, gingival swelling, dental caries and dental abscesses present.     Pharynx: Oropharynx is clear. Uvula midline.     Comments: Teeth numbers 18 and 19 are fractured with dental caries present.  The areas are tender to palpation, gingival swelling is present. Eyes:     Extraocular Movements: Extraocular movements intact.     Pupils: Pupils are equal, round, and reactive to light.  Cardiovascular:     Rate and Rhythm: Normal rate and regular rhythm.     Pulses: Normal pulses.      Heart sounds: Normal heart sounds.  Pulmonary:     Effort: Pulmonary effort is normal. No respiratory distress.     Breath sounds: Normal breath sounds. No stridor. No wheezing, rhonchi or rales.  Abdominal:     General: Bowel sounds are normal.     Palpations: Abdomen is soft.  Musculoskeletal:     Cervical back: Normal range of motion.  Lymphadenopathy:     Cervical: No cervical adenopathy.  Skin:    General: Skin is warm and dry.  Neurological:     General: No focal deficit present.     Mental Status: She is alert and oriented to person, place, and time.  Psychiatric:        Mood and Affect: Mood normal.        Behavior: Behavior normal.      UC Treatments / Results  Labs (all labs ordered are listed, but only abnormal results are displayed) Labs Reviewed - No data to display  EKG   Radiology No results found.  Procedures Procedures (including critical care time)  Medications Ordered in UC Medications  ketorolac (TORADOL) 30 MG/ML injection 30 mg (has no administration in time range)    Initial Impression / Assessment and Plan / UC Course  I have reviewed the triage vital signs and the nursing notes.  Pertinent labs & imaging results that were available during my care of the patient were reviewed by me and considered in my medical decision making (see chart for details).  Patient presents for complaints of swelling to the left lower jaw that is been present for the past several days.  On exam, she does have fractures to teeth numbers 18 and 19 along with dental caries.  Symptoms are consistent with dental abscess.  Will treat with clindamycin 450 mg 3 times daily for the next 7 days and Tylenol 650 mg for pain..  Supportive care recommendations were provided discussed with the patient  to include warm salt water gargles, a soft diet, and the use of warm or cool compresses.  Patient was provided a list of dental resources in the area as she will need follow-up within  the next 7 to 10 days for reevaluation.  Patient was also given strict ER follow-up precautions.  Patient was in agreement with this plan of care and verbalizes understanding.  All questions were answered.  Patient stable for discharge.  Work note was provided.   Final Clinical Impressions(s) / UC Diagnoses   Final diagnoses:  Dental abscess     Discharge Instructions      Take medication as prescribed.  Recommend taking the medication with a probiotic to prevent GI upset. Continue over-the-counter Tylenol as needed for pain or discomfort. Recommend the use of warm compresses to the affected area to help with pain or discomfort.  You may use cool compresses to help with pain or swelling. Warm salt water gargles 3-4 times daily as needed while symptoms persist. Continue a soft diet to include soup, broth, yogurt, pudding, or Jell-O while symptoms persist. It is recommended that you follow-up with a dentist within the next 7 to 10 days for reevaluation.  I have provided a list of dental resources in the area. Go to the emergency department if you experience worsening pain, facial swelling, or other concerns. Follow-up as needed.     ED Prescriptions     Medication Sig Dispense Auth. Provider   clindamycin (CLEOCIN) 150 MG capsule Take 3 capsules (450 mg total) by mouth 3 (three) times daily for 7 days. 63 capsule Leath-Warren, Etta PARAS, NP   acetaminophen (TYLENOL 8 HOUR) 650 MG CR tablet Take 1 tablet (650 mg total) by mouth every 8 (eight) hours. 30 tablet Leath-Warren, Etta PARAS, NP      PDMP not reviewed this encounter.   Gilmer Etta PARAS, NP 04/13/24 1101
